# Patient Record
Sex: Female | Born: 1945 | Race: White | Hispanic: No | Marital: Married
Health system: Southern US, Community
[De-identification: ages and names within clinical notes are randomized; demographics above are authoritative.]

---

## 2015-05-23 ENCOUNTER — Other Ambulatory Visit (HOSPITAL_COMMUNITY): Payer: Self-pay

## 2015-05-23 ENCOUNTER — Inpatient Hospital Stay
Admission: AD | Admit: 2015-05-23 | Discharge: 2015-06-13 | Disposition: A | Discharge status: Skilled Nursing Facility | Payer: Medicare Other | Source: Ambulatory Visit | Attending: Internal Medicine | Admitting: Internal Medicine

## 2015-05-23 DIAGNOSIS — Z930 Tracheostomy status: Secondary | ICD-10-CM | POA: Insufficient documentation

## 2015-05-23 DIAGNOSIS — Z931 Gastrostomy status: Secondary | ICD-10-CM

## 2015-05-23 DIAGNOSIS — J9690 Respiratory failure, unspecified, unspecified whether with hypoxia or hypercapnia: Secondary | ICD-10-CM

## 2015-05-23 MED ORDER — IOHEXOL 300 MG/ML  SOLN
50.0000 mL | Freq: Once | INTRAMUSCULAR | Status: DC | PRN
  Administered 2015-05-23: 30 mL via ORAL

## 2015-05-24 ENCOUNTER — Other Ambulatory Visit (HOSPITAL_COMMUNITY): Payer: Self-pay

## 2015-05-24 DIAGNOSIS — J9620 Acute and chronic respiratory failure, unspecified whether with hypoxia or hypercapnia: Secondary | ICD-10-CM | POA: Diagnosis not present

## 2015-05-24 DIAGNOSIS — Z930 Tracheostomy status: Secondary | ICD-10-CM | POA: Insufficient documentation

## 2015-05-24 DIAGNOSIS — J9690 Respiratory failure, unspecified, unspecified whether with hypoxia or hypercapnia: Secondary | ICD-10-CM | POA: Insufficient documentation

## 2015-05-24 LAB — CBC
HCT: 43.7 % (ref 36.0–46.0)
Hemoglobin: 13.7 g/dL (ref 12.0–15.0)
MCH: 28.2 pg (ref 26.0–34.0)
MCHC: 31.4 g/dL (ref 30.0–36.0)
MCV: 89.9 fL (ref 78.0–100.0)
PLATELETS: 176 10*3/uL (ref 150–400)
RBC: 4.86 MIL/uL (ref 3.87–5.11)
RDW: 16 % — AB (ref 11.5–15.5)
WBC: 19.3 10*3/uL — AB (ref 4.0–10.5)

## 2015-05-24 LAB — URINALYSIS, ROUTINE W REFLEX MICROSCOPIC
BILIRUBIN URINE: NEGATIVE
GLUCOSE, UA: NEGATIVE mg/dL
HGB URINE DIPSTICK: NEGATIVE
Ketones, ur: NEGATIVE mg/dL
Leukocytes, UA: NEGATIVE
Nitrite: NEGATIVE
Protein, ur: NEGATIVE mg/dL
SPECIFIC GRAVITY, URINE: 1.008 (ref 1.005–1.030)
UROBILINOGEN UA: 0.2 mg/dL (ref 0.0–1.0)
pH: 5 (ref 5.0–8.0)

## 2015-05-24 LAB — TSH: TSH: 0.863 u[IU]/mL (ref 0.350–4.500)

## 2015-05-24 LAB — BASIC METABOLIC PANEL
ANION GAP: 11 (ref 5–15)
BUN: 71 mg/dL — ABNORMAL HIGH (ref 6–20)
CALCIUM: 8.8 mg/dL — AB (ref 8.9–10.3)
CO2: 26 mmol/L (ref 22–32)
CREATININE: 0.93 mg/dL (ref 0.44–1.00)
Chloride: 112 mmol/L — ABNORMAL HIGH (ref 101–111)
Glucose, Bld: 220 mg/dL — ABNORMAL HIGH (ref 65–99)
Potassium: 4.8 mmol/L (ref 3.5–5.1)
Sodium: 149 mmol/L — ABNORMAL HIGH (ref 135–145)

## 2015-05-24 NOTE — Procedures (Signed)
Tracheostomy tube change: Informed verbal consent was obtained after explaining the risks (including bleeding and infection), benefits and alternatives of the procedure. Verbal timeout was performed prior to the procedure. The old  # 6 cuffed trach was carefully removed. the tracheostomy site appeared: unremarkable w/ exception of the copious trach secretions. A new #  6 Cuffed  trach was easily placed over a tube changer in the tracheostomy stoma and secured with velcro trach ties. The tracheostomy was patent, good color change observed via EZ-CAP, and the patient was easily able to voice with finger occlusion and tolerated the procedure well with no immediate complications.   Simonne Martinet ACNP-BC [facility] Pulmonary/Critical Care Pager # [phone] OR # [phone] if no answer

## 2015-05-24 NOTE — Consult Note (Signed)
Name: Cathy Archer MRN: [id number] DOB: 11/04/1942    ADMISSION DATE:  05/23/2015 CONSULTATION DATE:  9/22 REFERRING MD :  Sharyon Medicus  CHIEF COMPLAINT:   Tracheostomy care, and ventilator management  BRIEF PATIENT DESCRIPTION:  This is a 69 year old female patient who [facility] with altered mental status in the setting of what was identified later as intracranial hemorrhage, with intraventricular hemorrhage, obstructive hydrocephalus and brain mass complicated by inability to protect airway in resulting respiratory failure. She was treated initially with intraventricular drain, then went to the operating room for craniotomy and subtotal resection of tumor. Hospital course highlights include failure to wean, with trach and PEG placed on 9/16. Klebsiella pneumonia, cultures on 9/13. [facility], with eventual plan to initiate radiation therapy ideally in the 2-3 weeks from time of discharge.  Upon time of admission patient was on daytime aerosol trach collar, and nocturnal ventilation. She was unable to get adequate tidal volumes, with notable air leak, raising concern for blood tracheostomy cuff, therefore pulmonary was asked to evaluate and assist with weaning efforts as well as tracheostomy change.  SIGNIFICANT EVENTS    STUDIES:    PAST MEDICAL HISTORY :  Past medical history includes colon polyps, arthritis, Barrett's esophagus, diabetes, H. Pylori with resultant gastritis, hyperlipidemia, nonalcoholic steatosis, systolic and diastolic congestive heart failure. Prior to Admission medications   Labetalol, when necessary. DuoNeb every 6 hours when necessary, baclofen 10 mg every 8 hours as needed, ceftriaxone every 24 hours stop the planned for 9/28, hydralazine 10 mg every 4 hours as needed for systolic blood pressure greater than 170, torsemide 20 mg, the tube daily, gabapentin 100 mg daily via tube,  nystatin oral suspension every 6 hours, multivitamin, lisinopril 20 mg tablet daily, atorvastatin 10 mg each bedtime, Protonix IV 40 mg daily, metoprolol 25 mg via tube twice a day, hydralazine 25 mg via tube 4 times a day, dexamethasone 6 mg every 8 hours intravenous sliding scale insulin.   Allergies No known drug allergies  FAMILY HISTORY:  Bladder cancer, Crohn's disease, diabetes, hypertension, liver cancer. SOCIAL HISTORY: Prior smoker  REVIEW OF SYSTEMS:   unable  SUBJECTIVE:  Resting comfortably in the bed VITAL SIGNS: Temperature 90.9, pulse rate 102, respirations 20, blood pressure 121/72, pulse ox 99%  PHYSICAL EXAMINATION: General:  69 year old white female status post craniotomy resting comfortably in bed Neuro:  Awake, affect flat. Moves all extremities, appropriate. HEENT:  Craniotomy surgical site unremarkable, #6 cuffed trach was midline, has copious thick tan blood-tinged secretions Cardiovascular:  Regular rate and rhythm Lungs:  Unlabored, scattered diffuse rhonchi, no accessory muscle use Abdomen:  Soft, nontender, no organomegaly, PEG unremarkable. Has rectal tube in place. Musculoskeletal:  Good and equal tone, moves all extremities. Skin: , no edema   Recent Labs Lab 05/24/15 0714  NA 149*  K 4.8  CL 112*  CO2 26  BUN 71*  CREATININE 0.93  GLUCOSE 220*    Recent Labs Lab 05/24/15 0714  HGB 13.7  HCT 43.7  WBC 19.3*  PLT 176   Dg Chest Port 1 View  05/24/2015   CLINICAL DATA:  Acute respiratory failure  EXAM: PORTABLE CHEST - 1 VIEW  COMPARISON:  None.  FINDINGS: Cardiac shadow is within normal limits. A left-sided central venous line is noted in the distal superior vena cava. A tracheostomy tube is noted in place. Minimal basilar atelectasis is noted on the left. Some slightly more  marked density is noted in the right lung. No acute bony abnormality is seen. Old left humeral fracture is noted.  IMPRESSION: Bibasilar atelectasis right greater  than left.   Electronically Signed   By: Alcide Clever M.D.   On: 05/24/2015 08:06   Dg Abd Portable 1v  05/23/2015   CLINICAL DATA:  Percutaneous endoscopic gastrostomy tube placement.  EXAM: PORTABLE ABDOMEN - 1 VIEW  COMPARISON:  None.  FINDINGS: 30 mL Omnipaque was instilled via gastrostomy tube. Gastrostomy tube balloon is located along the distal greater curvature of the stomach. Contrast material is seen in the stomach and proximal duodenum. No contrast extravasation. Appearance is consistent with tube location within the stomach.  IMPRESSION: Gastrostomy tube appears to be located along the greater curvature of the stomach.   Electronically Signed   By: Burman Nieves M.D.   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 Still has copious purulent blood-tinged tracheal secretions. Tolerating aerosol trach collar otherwise. At this point she remains on trach collar during the day, and mechanical ventilation at night, she should be able to progress to trach collar only very soon. At this time her copious tracheal secretions would prevent her from speech valve trials. Unclear at this point as to whether or or not she will be a candidate for decannulation, her mental status will be the limiting factor for this. [facility] Would hold off on speaking valve trials at this point, given copious tracheal secretions Reculture sputum Continue current antibiotics for now It is too early to speculate as to whether not she can be decannulated  Status post subtotal resection of brain mass. This was complicated by intracranial  hemorrhage, with intraventricular hemorrhage, and obstructive hydrocephalus. The mass was not completely resected, there is concern about recurrence. Recommendations from outside hospital were to begin radiation therapy within 2-3 weeks Plan Continue rehabilitation efforts Continue Decadron, will defer to primary service, unclear what weaning plans were for this.  Hypertension Plan Continue antihypertensive regimen per primary team  Hypernatremia Plan Would recommend changing saline, and adding free water  Protein calorie malnutrition Plan Continue supplementation  Physical deconditioning Plan Continue rehabilitation efforts  Hyperglycemia, likely steroid induced. Plan Sliding scale insulin  .Simonne Martinet ACNP-BC [facility] Pulmonary/Critical Care Pager # [phone] OR # [phone] if no answer   05/24/2015, 9:09 AM  Reviewed above.  69 yo female admitted with IVH from brain mass.  Intubated for airway protection and eventually needed trach.  She was tx for Klebsiella PNA.  She has been doing trach collar during day, and vent support at night.  She follows simple commands.  Trach site with clear to yellow/red secretions.  Scattered rhonchi.  Abd soft, PEG site clean.  Labs from 9/22 reviewed and remarkable for >> WBC 19.3, Na 149, Glucose 220.  She is noted to have trach cuff blown >> changed 9/22.  Will continue trach collar during day and vent support at night.  She might eventually wean off vent.  Would not consider decannulation until her physical conditioning is significantly more improved.  Coralyn Helling, MD [facility] Pulmonary/Critical Care 05/24/2015, 10:23 AM Pager:  [phone] After 3pm call: [phone]

## 2015-05-25 LAB — BASIC METABOLIC PANEL
Anion gap: 12 (ref 5–15)
BUN: 125 mg/dL — AB (ref 6–20)
CALCIUM: 8 mg/dL — AB (ref 8.9–10.3)
CHLORIDE: 111 mmol/L (ref 101–111)
CO2: 25 mmol/L (ref 22–32)
CREATININE: 1.93 mg/dL — AB (ref 0.44–1.00)
GFR calc Af Amer: 29 mL/min — ABNORMAL LOW (ref >60)
GFR calc non Af Amer: 25 mL/min — ABNORMAL LOW (ref >60)
Glucose, Bld: 280 mg/dL — ABNORMAL HIGH (ref 65–99)
Potassium: 5.5 mmol/L — ABNORMAL HIGH (ref 3.5–5.1)
SODIUM: 148 mmol/L — AB (ref 135–145)

## 2015-05-25 LAB — CBC
HCT: 40.4 % (ref 36.0–46.0)
HEMOGLOBIN: 12.4 g/dL (ref 12.0–15.0)
MCH: 28 pg (ref 26.0–34.0)
MCHC: 30.7 g/dL (ref 30.0–36.0)
MCV: 91.2 fL (ref 78.0–100.0)
Platelets: 130 10*3/uL — ABNORMAL LOW (ref 150–400)
RBC: 4.43 MIL/uL (ref 3.87–5.11)
RDW: 16.7 % — AB (ref 11.5–15.5)
WBC: 14.5 10*3/uL — ABNORMAL HIGH (ref 4.0–10.5)

## 2015-05-25 LAB — URINE CULTURE: CULTURE: NO GROWTH

## 2015-05-25 LAB — TSH: TSH: 3.605 u[IU]/mL (ref 0.350–4.500)

## 2015-05-25 LAB — MAGNESIUM: MAGNESIUM: 2.9 mg/dL — AB (ref 1.7–2.4)

## 2015-05-26 ENCOUNTER — Other Ambulatory Visit (HOSPITAL_COMMUNITY): Payer: Self-pay

## 2015-05-26 LAB — URINE MICROSCOPIC-ADD ON

## 2015-05-26 LAB — CBC
HEMATOCRIT: 41.2 % (ref 36.0–46.0)
HEMOGLOBIN: 12.7 g/dL (ref 12.0–15.0)
MCH: 28.4 pg (ref 26.0–34.0)
MCHC: 30.8 g/dL (ref 30.0–36.0)
MCV: 92.2 fL (ref 78.0–100.0)
Platelets: 121 10*3/uL — ABNORMAL LOW (ref 150–400)
RBC: 4.47 MIL/uL (ref 3.87–5.11)
RDW: 16.8 % — ABNORMAL HIGH (ref 11.5–15.5)
WBC: 16.8 10*3/uL — ABNORMAL HIGH (ref 4.0–10.5)

## 2015-05-26 LAB — URINALYSIS, ROUTINE W REFLEX MICROSCOPIC
BILIRUBIN URINE: NEGATIVE
Ketones, ur: NEGATIVE mg/dL
Leukocytes, UA: NEGATIVE
Nitrite: NEGATIVE
PROTEIN: NEGATIVE mg/dL
Specific Gravity, Urine: 1.022 (ref 1.005–1.030)
Urobilinogen, UA: 0.2 mg/dL (ref 0.0–1.0)
pH: 5 (ref 5.0–8.0)

## 2015-05-26 LAB — BASIC METABOLIC PANEL
ANION GAP: 12 (ref 5–15)
BUN: 88 mg/dL — ABNORMAL HIGH (ref 6–20)
CHLORIDE: 110 mmol/L (ref 101–111)
CO2: 29 mmol/L (ref 22–32)
Calcium: 8.1 mg/dL — ABNORMAL LOW (ref 8.9–10.3)
Creatinine, Ser: 1.21 mg/dL — ABNORMAL HIGH (ref 0.44–1.00)
GFR calc non Af Amer: 44 mL/min — ABNORMAL LOW (ref >60)
GFR, EST AFRICAN AMERICAN: 51 mL/min — AB (ref >60)
Glucose, Bld: 411 mg/dL — ABNORMAL HIGH (ref 65–99)
Potassium: 4.1 mmol/L (ref 3.5–5.1)
Sodium: 151 mmol/L — ABNORMAL HIGH (ref 135–145)

## 2015-05-28 DIAGNOSIS — E870 Hyperosmolality and hypernatremia: Secondary | ICD-10-CM | POA: Diagnosis not present

## 2015-05-28 DIAGNOSIS — J9620 Acute and chronic respiratory failure, unspecified whether with hypoxia or hypercapnia: Secondary | ICD-10-CM

## 2015-05-28 DIAGNOSIS — E46 Unspecified protein-calorie malnutrition: Secondary | ICD-10-CM

## 2015-05-28 DIAGNOSIS — Z930 Tracheostomy status: Secondary | ICD-10-CM

## 2015-05-28 LAB — BASIC METABOLIC PANEL
ANION GAP: 6 (ref 5–15)
BUN: 55 mg/dL — ABNORMAL HIGH (ref 6–20)
CO2: 33 mmol/L — ABNORMAL HIGH (ref 22–32)
Calcium: 8.8 mg/dL — ABNORMAL LOW (ref 8.9–10.3)
Chloride: 117 mmol/L — ABNORMAL HIGH (ref 101–111)
Creatinine, Ser: 0.99 mg/dL (ref 0.44–1.00)
GFR, EST NON AFRICAN AMERICAN: 56 mL/min — AB (ref >60)
Glucose, Bld: 372 mg/dL — ABNORMAL HIGH (ref 65–99)
POTASSIUM: 4.5 mmol/L (ref 3.5–5.1)
SODIUM: 156 mmol/L — AB (ref 135–145)

## 2015-05-28 LAB — CBC
HEMATOCRIT: 38.4 % (ref 36.0–46.0)
HEMOGLOBIN: 11.8 g/dL — AB (ref 12.0–15.0)
MCH: 28.6 pg (ref 26.0–34.0)
MCHC: 30.7 g/dL (ref 30.0–36.0)
MCV: 93.2 fL (ref 78.0–100.0)
Platelets: 81 10*3/uL — ABNORMAL LOW (ref 150–400)
RBC: 4.12 MIL/uL (ref 3.87–5.11)
RDW: 16.6 % — AB (ref 11.5–15.5)
WBC: 10.4 10*3/uL (ref 4.0–10.5)

## 2015-05-28 NOTE — Progress Notes (Signed)
Name: Cathy Archer MRN: [id number] DOB: [birth year]    ADMISSION DATE:  05/23/2015 CONSULTATION DATE:  9/22 REFERRING MD :  Sharyon Medicus  CHIEF COMPLAINT:   Tracheostomy care, and ventilator management  BRIEF PATIENT DESCRIPTION:  This is a 69 year old female patient who [facility] with altered mental status in the setting of what was identified later as intracranial hemorrhage, with intraventricular hemorrhage, obstructive hydrocephalus and brain mass complicated by inability to protect airway in resulting respiratory failure. She was treated initially with intraventricular drain, then went to the operating room for craniotomy and subtotal resection of tumor. Hospital course highlights include failure to wean, with trach and PEG placed on 9/16. Klebsiella pneumonia, cultures on 9/13. [facility], with eventual plan to initiate radiation therapy ideally in the 2-3 weeks from time of discharge.  Upon time of admission patient was on daytime aerosol trach collar, and nocturnal ventilation. She was unable to get adequate tidal volumes, with notable air leak, raising concern for blood tracheostomy cuff, therefore pulmonary was asked to evaluate and assist with weaning efforts as well as tracheostomy change.  SIGNIFICANT EVENTS  PCCM consulted 9/22: ruptured trach cuff noted so it was replaced.   SUBJECTIVE:  Resting comfortably in the bed VITAL SIGNS: Temperature 98.5, HR 113, rr 20 sats 93% on 35% PHYSICAL EXAMINATION: General:  69 year old white female status post craniotomy resting comfortably in bed Neuro:  Awake, affect flat/ withdrawn Moves all extremities, appropriate. HEENT:  Craniotomy surgical site unremarkable, #6 cuffed trach was midline, secretions a little better  Cardiovascular:  Regular rate and rhythm Lungs:  Unlabored, scattered rhonchi, no accessory muscle use Abdomen:  Soft, nontender,  no organomegaly, PEG unremarkable. Has rectal tube in place. Musculoskeletal:  Good and equal tone, moves all extremities. Skin: , no edema   Recent Labs Lab 05/25/15 2020 05/26/15 1245 05/28/15 0600  NA 148* 151* 156*  K 5.5* 4.1 4.5  CL 111 110 117*  CO2 25 29 33*  BUN 125* 88* 55*  CREATININE 1.93* 1.21* 0.99  GLUCOSE 280* 411* 372*    Recent Labs Lab 05/25/15 2020 05/26/15 1245 05/28/15 0810  HGB 12.4 12.7 11.8*  HCT 40.4 41.2 38.4  WBC 14.5* 16.8* 10.4  PLT 130* 121* PENDING   No results found. 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 At this point she remains on trach collar during the day.  Unclear at this point as to whether or or not she will be a candidate for decannulation, her mental status will be the limiting factor for this. [facility] Would hold off on speaking valve trials at this point, given copious tracheal secretions f/u sputum Continue current antibiotics for now It is too early to speculate as to whether not she can be decannulated  Status post subtotal resection of brain mass. This was complicated by intracranial hemorrhage, with intraventricular hemorrhage, and obstructive hydrocephalus. The mass was not completely resected, there is concern about recurrence. Recommendations from outside hospital were to begin radiation therapy within 2-3 weeks Plan Continue rehabilitation efforts Continue Decadron, will defer to primary service, unclear what weaning plans were for this.  Hypertension Plan Continue antihypertensive  regimen per primary team  Hypernatremia & hyperchloremia--> this  continues to worsen.  Plan Would d/c NS and replace free water.   Protein calorie malnutrition Plan Continue supplementation  Physical deconditioning Plan Continue rehabilitation efforts  Hyperglycemia, likely steroid induced. Plan Sliding scale insulin  .Simonne Martinet ACNP-BC [facility] Pulmonary/Critical Care Pager # [phone] OR # [phone] if no answer  Attending Note:  Above reviewed.  I reviewed CXR myself, tracheostomy in good position.  Discussed with PCCM-NP and SSH-MD.  69 year female with ICH from brain mass.  Intubated then now trached for airway protection.  Weaning to TC during the day and vent at night.  Increased secretion and follows simple commands on exam.  I reviewed labs 9/26 myself WBC down to 10.4 but Na is up to 156.  Continue weaning per protocol as above, no PMV given copious secretion.  Address hypernatremia with d/c of NS and place on free water.  F/U labs as ordered.  Needs more aggressive rehab and supplementation of diet as above.   PCCM will continue to follow.  Patient seen and examined, agree with above note.  I dictated the care and orders written for this patient under my direction.  Alyson Reedy, MD [phone]  05/28/2015, 9:20 AM

## 2015-05-29 LAB — BASIC METABOLIC PANEL
Anion gap: 11 (ref 5–15)
BUN: 61 mg/dL — ABNORMAL HIGH (ref 6–20)
CHLORIDE: 111 mmol/L (ref 101–111)
CO2: 29 mmol/L (ref 22–32)
CREATININE: 1.16 mg/dL — AB (ref 0.44–1.00)
Calcium: 8.4 mg/dL — ABNORMAL LOW (ref 8.9–10.3)
GFR calc non Af Amer: 48 mL/min — ABNORMAL LOW (ref >60)
GFR, EST AFRICAN AMERICAN: 55 mL/min — AB (ref >60)
Glucose, Bld: 413 mg/dL — ABNORMAL HIGH (ref 65–99)
Potassium: 4.2 mmol/L (ref 3.5–5.1)
Sodium: 151 mmol/L — ABNORMAL HIGH (ref 135–145)

## 2015-05-29 LAB — CULTURE, RESPIRATORY W GRAM STAIN

## 2015-05-29 LAB — CULTURE, RESPIRATORY

## 2015-05-30 LAB — COMPREHENSIVE METABOLIC PANEL
ALBUMIN: 1.6 g/dL — AB (ref 3.5–5.0)
ALK PHOS: 84 U/L (ref 38–126)
ALT: 65 U/L — AB (ref 14–54)
ANION GAP: 6 (ref 5–15)
AST: 27 U/L (ref 15–41)
BUN: 55 mg/dL — AB (ref 6–20)
CALCIUM: 8.1 mg/dL — AB (ref 8.9–10.3)
CO2: 32 mmol/L (ref 22–32)
CREATININE: 0.93 mg/dL (ref 0.44–1.00)
Chloride: 108 mmol/L (ref 101–111)
GFR calc Af Amer: >60 mL/min (ref >60)
GFR calc non Af Amer: >60 mL/min (ref >60)
GLUCOSE: 427 mg/dL — AB (ref 65–99)
Potassium: 5 mmol/L (ref 3.5–5.1)
SODIUM: 146 mmol/L — AB (ref 135–145)
Total Bilirubin: 0.8 mg/dL (ref 0.3–1.2)
Total Protein: 4.1 g/dL — ABNORMAL LOW (ref 6.5–8.1)

## 2015-05-31 LAB — BASIC METABOLIC PANEL
ANION GAP: 6 (ref 5–15)
BUN: 44 mg/dL — ABNORMAL HIGH (ref 6–20)
CALCIUM: 8.1 mg/dL — AB (ref 8.9–10.3)
CHLORIDE: 101 mmol/L (ref 101–111)
CO2: 31 mmol/L (ref 22–32)
CREATININE: 0.75 mg/dL (ref 0.44–1.00)
GFR calc non Af Amer: >60 mL/min (ref >60)
Glucose, Bld: 380 mg/dL — ABNORMAL HIGH (ref 65–99)
Potassium: 4.8 mmol/L (ref 3.5–5.1)
SODIUM: 138 mmol/L (ref 135–145)

## 2015-06-02 ENCOUNTER — Other Ambulatory Visit (HOSPITAL_COMMUNITY): Payer: Self-pay

## 2015-06-02 LAB — CBC
HEMATOCRIT: 34.1 % — AB (ref 36.0–46.0)
Hemoglobin: 11.2 g/dL — ABNORMAL LOW (ref 12.0–15.0)
MCH: 28.5 pg (ref 26.0–34.0)
MCHC: 32.8 g/dL (ref 30.0–36.0)
MCV: 86.8 fL (ref 78.0–100.0)
Platelets: 23 10*3/uL — CL (ref 150–400)
RBC: 3.93 MIL/uL (ref 3.87–5.11)
RDW: 15.4 % (ref 11.5–15.5)
WBC: 11.4 10*3/uL — ABNORMAL HIGH (ref 4.0–10.5)

## 2015-06-02 LAB — BASIC METABOLIC PANEL
Anion gap: 9 (ref 5–15)
BUN: 32 mg/dL — AB (ref 6–20)
CALCIUM: 8.4 mg/dL — AB (ref 8.9–10.3)
CO2: 27 mmol/L (ref 22–32)
CREATININE: 0.58 mg/dL (ref 0.44–1.00)
Chloride: 105 mmol/L (ref 101–111)
GFR calc Af Amer: >60 mL/min (ref >60)
Glucose, Bld: 282 mg/dL — ABNORMAL HIGH (ref 65–99)
Potassium: 4.4 mmol/L (ref 3.5–5.1)
SODIUM: 141 mmol/L (ref 135–145)

## 2015-06-02 LAB — MAGNESIUM: MAGNESIUM: 2.2 mg/dL (ref 1.7–2.4)

## 2015-06-03 LAB — CBC
HCT: 32.8 % — ABNORMAL LOW (ref 36.0–46.0)
HEMOGLOBIN: 10.9 g/dL — AB (ref 12.0–15.0)
MCH: 28.6 pg (ref 26.0–34.0)
MCHC: 33.2 g/dL (ref 30.0–36.0)
MCV: 86.1 fL (ref 78.0–100.0)
Platelets: 31 10*3/uL — ABNORMAL LOW (ref 150–400)
RBC: 3.81 MIL/uL — AB (ref 3.87–5.11)
RDW: 15.5 % (ref 11.5–15.5)
WBC: 13.2 10*3/uL — AB (ref 4.0–10.5)

## 2015-06-03 LAB — BRAIN NATRIURETIC PEPTIDE: B Natriuretic Peptide: 138.6 pg/mL — ABNORMAL HIGH (ref 0.0–100.0)

## 2015-06-04 LAB — CBC
HEMATOCRIT: 35.6 % — AB (ref 36.0–46.0)
HEMOGLOBIN: 11.5 g/dL — AB (ref 12.0–15.0)
MCH: 28 pg (ref 26.0–34.0)
MCHC: 32.3 g/dL (ref 30.0–36.0)
MCV: 86.8 fL (ref 78.0–100.0)
Platelets: 43 10*3/uL — ABNORMAL LOW (ref 150–400)
RBC: 4.1 MIL/uL (ref 3.87–5.11)
RDW: 15.7 % — AB (ref 11.5–15.5)
WBC: 15.3 10*3/uL — ABNORMAL HIGH (ref 4.0–10.5)

## 2015-06-04 LAB — BASIC METABOLIC PANEL
Anion gap: 10 (ref 5–15)
BUN: 27 mg/dL — AB (ref 6–20)
CALCIUM: 8.2 mg/dL — AB (ref 8.9–10.3)
CHLORIDE: 95 mmol/L — AB (ref 101–111)
CO2: 29 mmol/L (ref 22–32)
CREATININE: 0.65 mg/dL (ref 0.44–1.00)
GFR calc Af Amer: >60 mL/min (ref >60)
GFR calc non Af Amer: >60 mL/min (ref >60)
GLUCOSE: 335 mg/dL — AB (ref 65–99)
Potassium: 4.1 mmol/L (ref 3.5–5.1)
Sodium: 134 mmol/L — ABNORMAL LOW (ref 135–145)

## 2015-06-05 DIAGNOSIS — R5381 Other malaise: Secondary | ICD-10-CM

## 2015-06-05 DIAGNOSIS — Z930 Tracheostomy status: Secondary | ICD-10-CM | POA: Diagnosis not present

## 2015-06-05 DIAGNOSIS — J9620 Acute and chronic respiratory failure, unspecified whether with hypoxia or hypercapnia: Secondary | ICD-10-CM | POA: Diagnosis not present

## 2015-06-05 LAB — GRAM STAIN

## 2015-06-05 NOTE — Progress Notes (Signed)
Name: Cathy Archer MRN: [id number] DOB: 1946-07-24    ADMISSION DATE:  05/23/2015 CONSULTATION DATE:  9/22 REFERRING MD :  Sharyon Medicus  CHIEF COMPLAINT:   Tracheostomy care, and ventilator management  BRIEF PATIENT DESCRIPTION:  69 year old female patient who was admitted to [facility] on 05/03/2015 with altered mental status in the setting of what was identified later as intracranial hemorrhage, with intraventricular hemorrhage, obstructive hydrocephalus and brain mass complicated by inability to protect airway in resulting respiratory failure. She was treated initially with intraventricular drain, then went to the operating room for craniotomy and subtotal resection of tumor. Hospital course highlights include failure to wean, with trach and PEG placed on 9/16. Klebsiella pneumonia, cultures on 9/13. She was transferred to [facility] for weaning efforts, with eventual plan to initiate radiation therapy ideally in the 2-3 weeks from time of discharge.  Upon time of admission patient was on daytime aerosol trach collar, and nocturnal ventilation. She was unable to get adequate tidal volumes, with notable air leak, raising concern for blown tracheostomy cuff, therefore pulmonary was asked to evaluate and assist with weaning efforts as well as tracheostomy change.  SIGNIFICANT EVENTS  9/22  PCCM consulted ruptured trach cuff noted so it was replaced.   SUBJECTIVE:  RT reports pt weaning on ATC 24/7.  No plans for decannulation  VITAL SIGNS:  98.6, 64, 14, 102/50, 95%  PHYSICAL EXAMINATION: General:  WF resting comfortably in bed Neuro:  Awake, affect flat/ withdrawn, moves all extremities, appropriate. HEENT:  Craniotomy surgical site unremarkable, trach midline, secretions a little better  Cardiovascular:  Regular rate and rhythm Lungs:  Unlabored, scattered rhonchi, no accessory muscle use Abdomen:  Soft, nontender, no organomegaly, PEG unremarkable. Has rectal tube in  place. Musculoskeletal:  Good and equal tone, moves all extremities. Skin: , no edema   Recent Labs Lab 05/31/15 0600 06/02/15 0835 06/04/15 0524  NA 138 141 134*  K 4.8 4.4 4.1  CL 101 105 95*  CO2 BUN 44* 32* 27*  CREATININE 0.75 0.58 0.65  GLUCOSE 380* 282* 335*    Recent Labs Lab 06/02/15 0835 06/03/15 0520 06/04/15 0524  HGB 11.2* 10.9* 11.5*  HCT 34.1* 32.8* 35.6*  WBC 11.4* 13.2* 15.3*  PLT 23* 31* 43*   No results found.   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 At this point she remains on trach collar during the day.  Unclear at this point as to whether or or not she will be a candidate for decannulation, her mental status will be the limiting factor for this.  She would need to be alert and physically stronger before consideration.    Plan [facility] ATC as tolerated  Wean O2 for saturations > 90% Pulmonary hygiene as able  Defer antibiotics to primary service Note 9/24 sputum culture with serratia  It is too early to speculate as to whether not she can be decannulated.  However, no plans at this time.   Status post subtotal resection of brain mass. This was complicated by intracranial hemorrhage, with intraventricular hemorrhage, and obstructive hydrocephalus. The mass was not completely resected, there is concern about recurrence. Recommendations from outside hospital were to begin radiation therapy within 2-3 weeks  Plan Continue  rehabilitation efforts Decadron per primary service.  Hypertension  Plan Continue antihypertensive regimen per primary team  Hypernatremia & hyperchloremia--> this continues to worsen.   Plan Monitor serial  BMP Free water for goal Na of 135-145 Defer to primary   Protein calorie malnutrition Plan Continue supplementation  Physical deconditioning Plan Continue rehabilitation efforts  Hyperglycemia, likely steroid induced. Plan Sliding scale insulin   PCCM will be available as needed.  Please call back if patient has progress toward decannulation for re-evaluation.    Canary Brim, NP-C [place] Pulmonary & Critical Care Pgr: [phone] or if no answer [phone] 06/05/2015, 10:46 AM  Attending Note:  Above reviewed.  I reviewed CXR myself, tracheostomy in good position.  Discussed with PCCM-NP and SSH-MD.  69 year female with ICH from brain mass. Intubated then now trached for airway protection. Weaning to TC during the day and vent at night. Increased secretion and follows simple commands on exam.  I reviewed labs 10/03 myself WBC up to 15.3 but Na is down to 134.  TC 24/7 at this point.  Patient has very little control over her neck and will need further radiation.  Would not recommend decannulation under any circumstances.  PCCM will sign off, please call back if needed.  Patient seen and examined, agree with above note. I dictated the care and orders written for this patient under my direction.  Alyson Reedy, MD [phone]

## 2015-06-06 LAB — BASIC METABOLIC PANEL
Anion gap: 10 (ref 5–15)
BUN: 29 mg/dL — AB (ref 6–20)
CALCIUM: 8.1 mg/dL — AB (ref 8.9–10.3)
CHLORIDE: 96 mmol/L — AB (ref 101–111)
CO2: 30 mmol/L (ref 22–32)
CREATININE: 0.58 mg/dL (ref 0.44–1.00)
GFR calc Af Amer: >60 mL/min (ref >60)
GFR calc non Af Amer: >60 mL/min (ref >60)
Glucose, Bld: 270 mg/dL — ABNORMAL HIGH (ref 65–99)
Potassium: 4.2 mmol/L (ref 3.5–5.1)
SODIUM: 136 mmol/L (ref 135–145)

## 2015-06-07 LAB — CBC
HCT: 36.4 % (ref 36.0–46.0)
HEMOGLOBIN: 11.5 g/dL — AB (ref 12.0–15.0)
MCH: 28 pg (ref 26.0–34.0)
MCHC: 31.6 g/dL (ref 30.0–36.0)
MCV: 88.8 fL (ref 78.0–100.0)
Platelets: 87 10*3/uL — ABNORMAL LOW (ref 150–400)
RBC: 4.1 MIL/uL (ref 3.87–5.11)
RDW: 16.5 % — ABNORMAL HIGH (ref 11.5–15.5)
WBC: 10.2 10*3/uL (ref 4.0–10.5)

## 2015-06-08 LAB — CULTURE, RESPIRATORY

## 2015-06-08 LAB — CULTURE, RESPIRATORY W GRAM STAIN

## 2015-06-11 LAB — CBC
HEMATOCRIT: 32.3 % — AB (ref 36.0–46.0)
HEMOGLOBIN: 10.1 g/dL — AB (ref 12.0–15.0)
MCH: 28.1 pg (ref 26.0–34.0)
MCHC: 31.3 g/dL (ref 30.0–36.0)
MCV: 90 fL (ref 78.0–100.0)
Platelets: 163 10*3/uL (ref 150–400)
RBC: 3.59 MIL/uL — AB (ref 3.87–5.11)
RDW: 17.1 % — ABNORMAL HIGH (ref 11.5–15.5)
WBC: 8.9 10*3/uL (ref 4.0–10.5)

## 2015-06-11 LAB — BASIC METABOLIC PANEL
ANION GAP: 8 (ref 5–15)
BUN: 26 mg/dL — ABNORMAL HIGH (ref 6–20)
CALCIUM: 8.1 mg/dL — AB (ref 8.9–10.3)
CHLORIDE: 94 mmol/L — AB (ref 101–111)
CO2: 35 mmol/L — AB (ref 22–32)
Creatinine, Ser: 0.49 mg/dL (ref 0.44–1.00)
GFR calc non Af Amer: >60 mL/min (ref >60)
GLUCOSE: 300 mg/dL — AB (ref 65–99)
POTASSIUM: 4 mmol/L (ref 3.5–5.1)
Sodium: 137 mmol/L (ref 135–145)

## 2016-10-03 IMAGING — CR DG ABD PORTABLE 1V
1 series · 1 of 1 positions shown · non-contrast
Comparison: None.

CLINICAL DATA: Percutaneous endoscopic gastrostomy tube placement.

EXAM:
PORTABLE ABDOMEN - 1 VIEW

[AP]
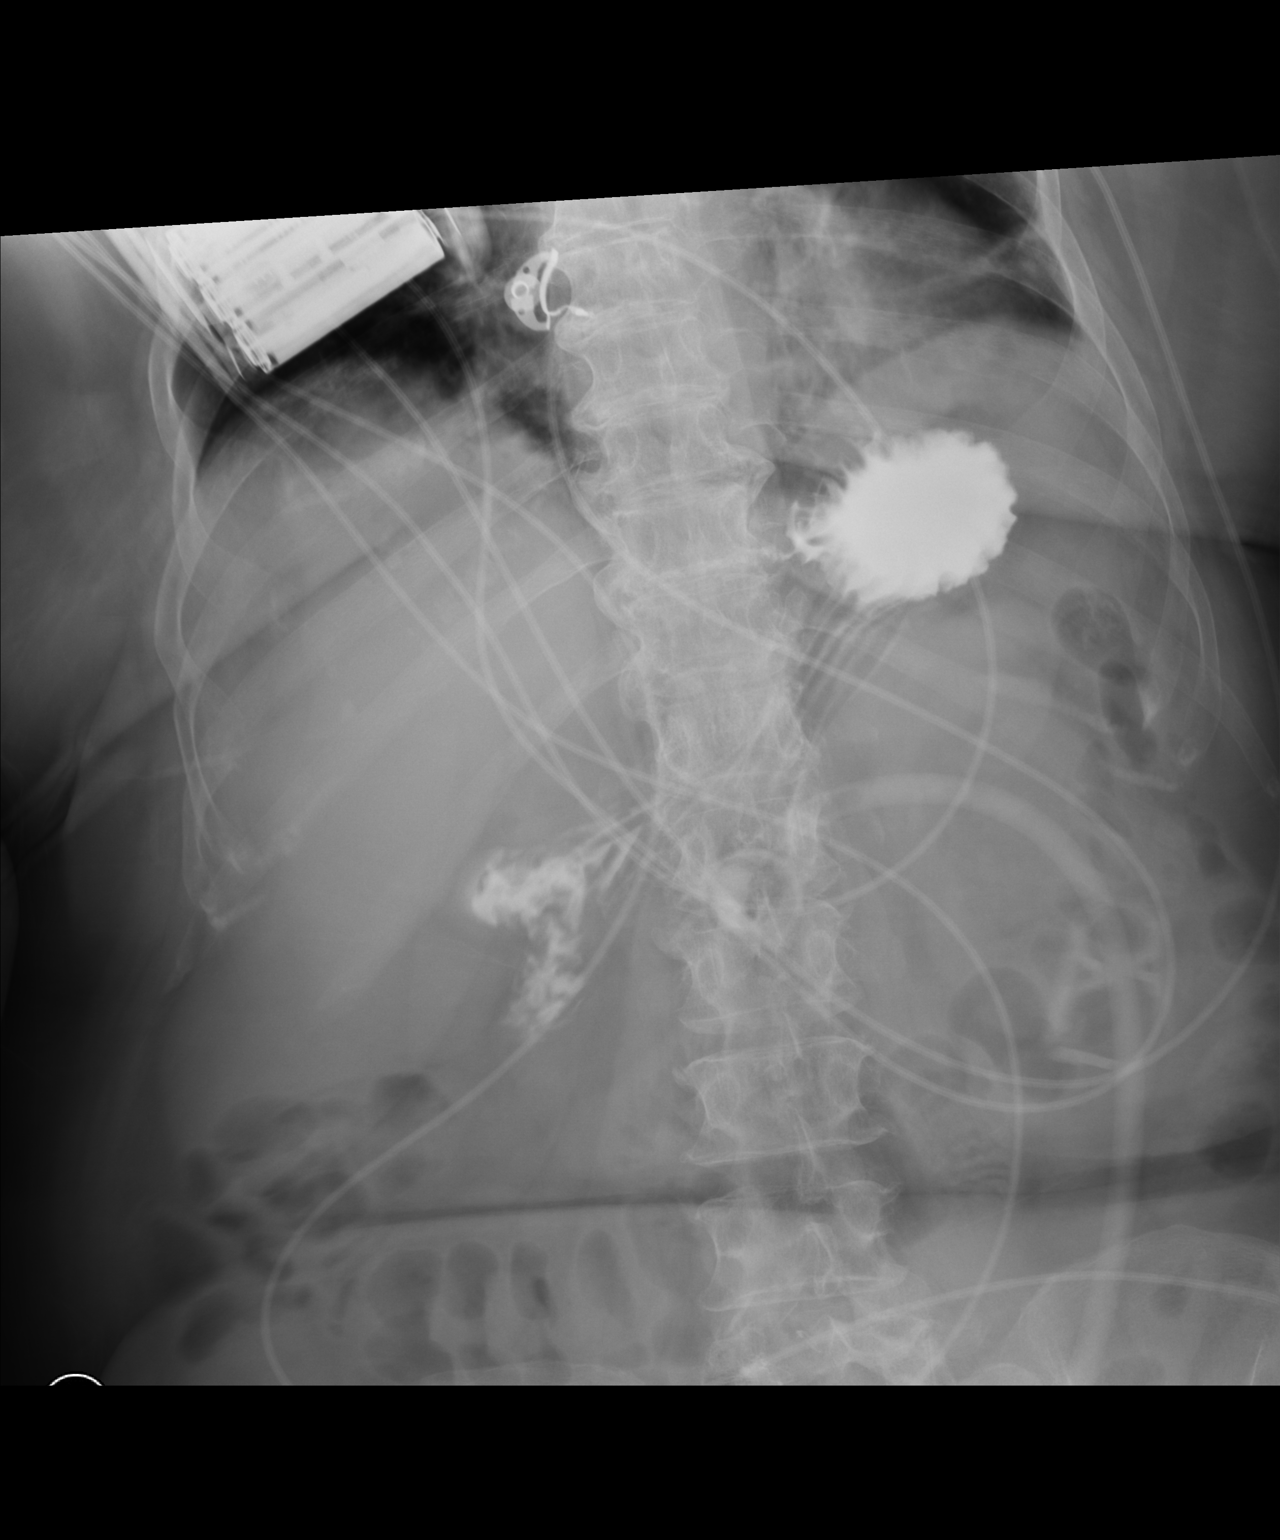

[1 of 1 positions shown; findings below may reference images not displayed]

FINDINGS: 30 mL Omnipaque was instilled via gastrostomy tube. Gastrostomy tube
balloon is located along the distal greater curvature of the
stomach. Contrast material is seen in the stomach and proximal
duodenum. No contrast extravasation. Appearance is consistent with
tube location within the stomach.
IMPRESSION: Gastrostomy tube appears to be located along the greater curvature
of the stomach.

## 2016-10-04 IMAGING — CR DG CHEST 1V PORT
1 series · 1 of 1 positions shown · non-contrast
Comparison: None.

CLINICAL DATA: Acute respiratory failure

EXAM:
PORTABLE CHEST - 1 VIEW

[AP]
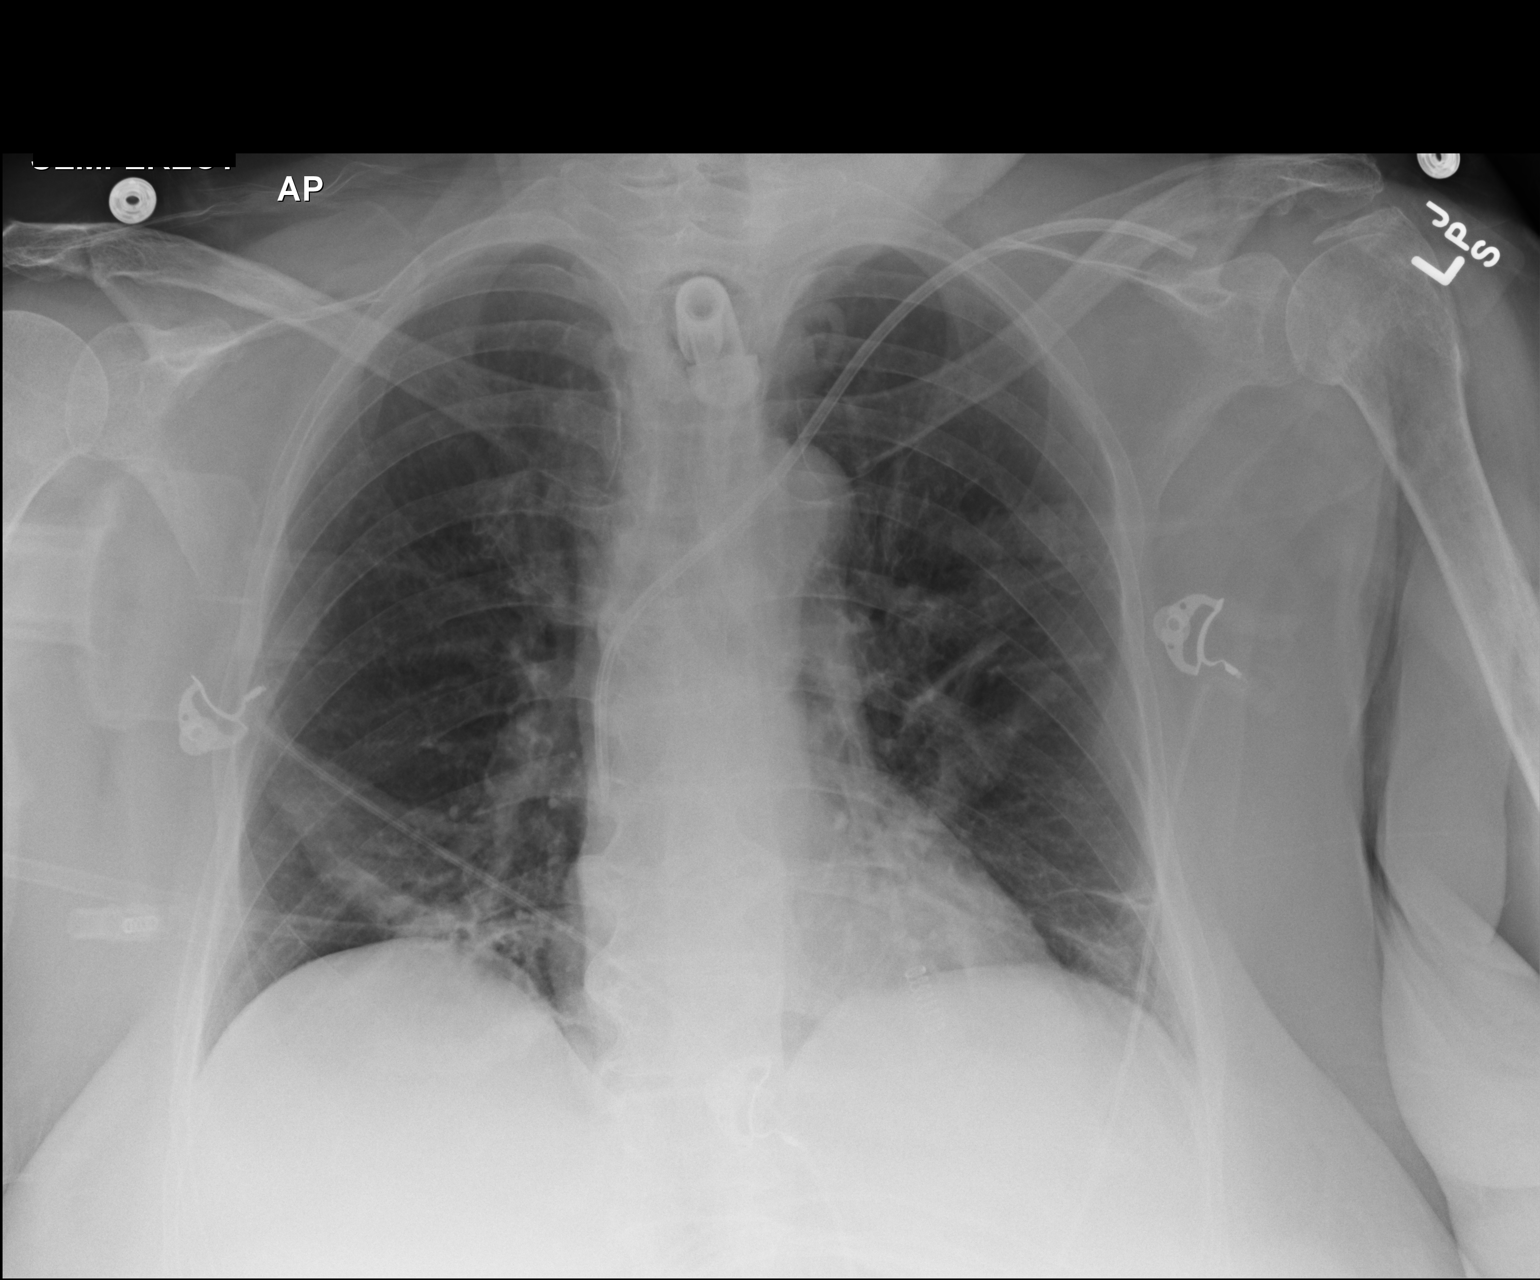

[1 of 1 positions shown; findings below may reference images not displayed]

FINDINGS: Cardiac shadow is within normal limits. A left-sided central venous
line is noted in the distal superior vena cava. A tracheostomy tube
is noted in place. Minimal basilar atelectasis is noted on the left.
Some slightly more marked density is noted in the right lung. No
acute bony abnormality is seen. Old left humeral fracture is noted.
IMPRESSION: Bibasilar atelectasis right greater than left.

## 2016-10-06 IMAGING — CR DG CHEST 1V PORT
1 series · 1 of 1 positions shown · non-contrast
Comparison: 05/24/2015

CLINICAL DATA: Respiratory failure.

EXAM:
PORTABLE CHEST 1 VIEW

[AP]
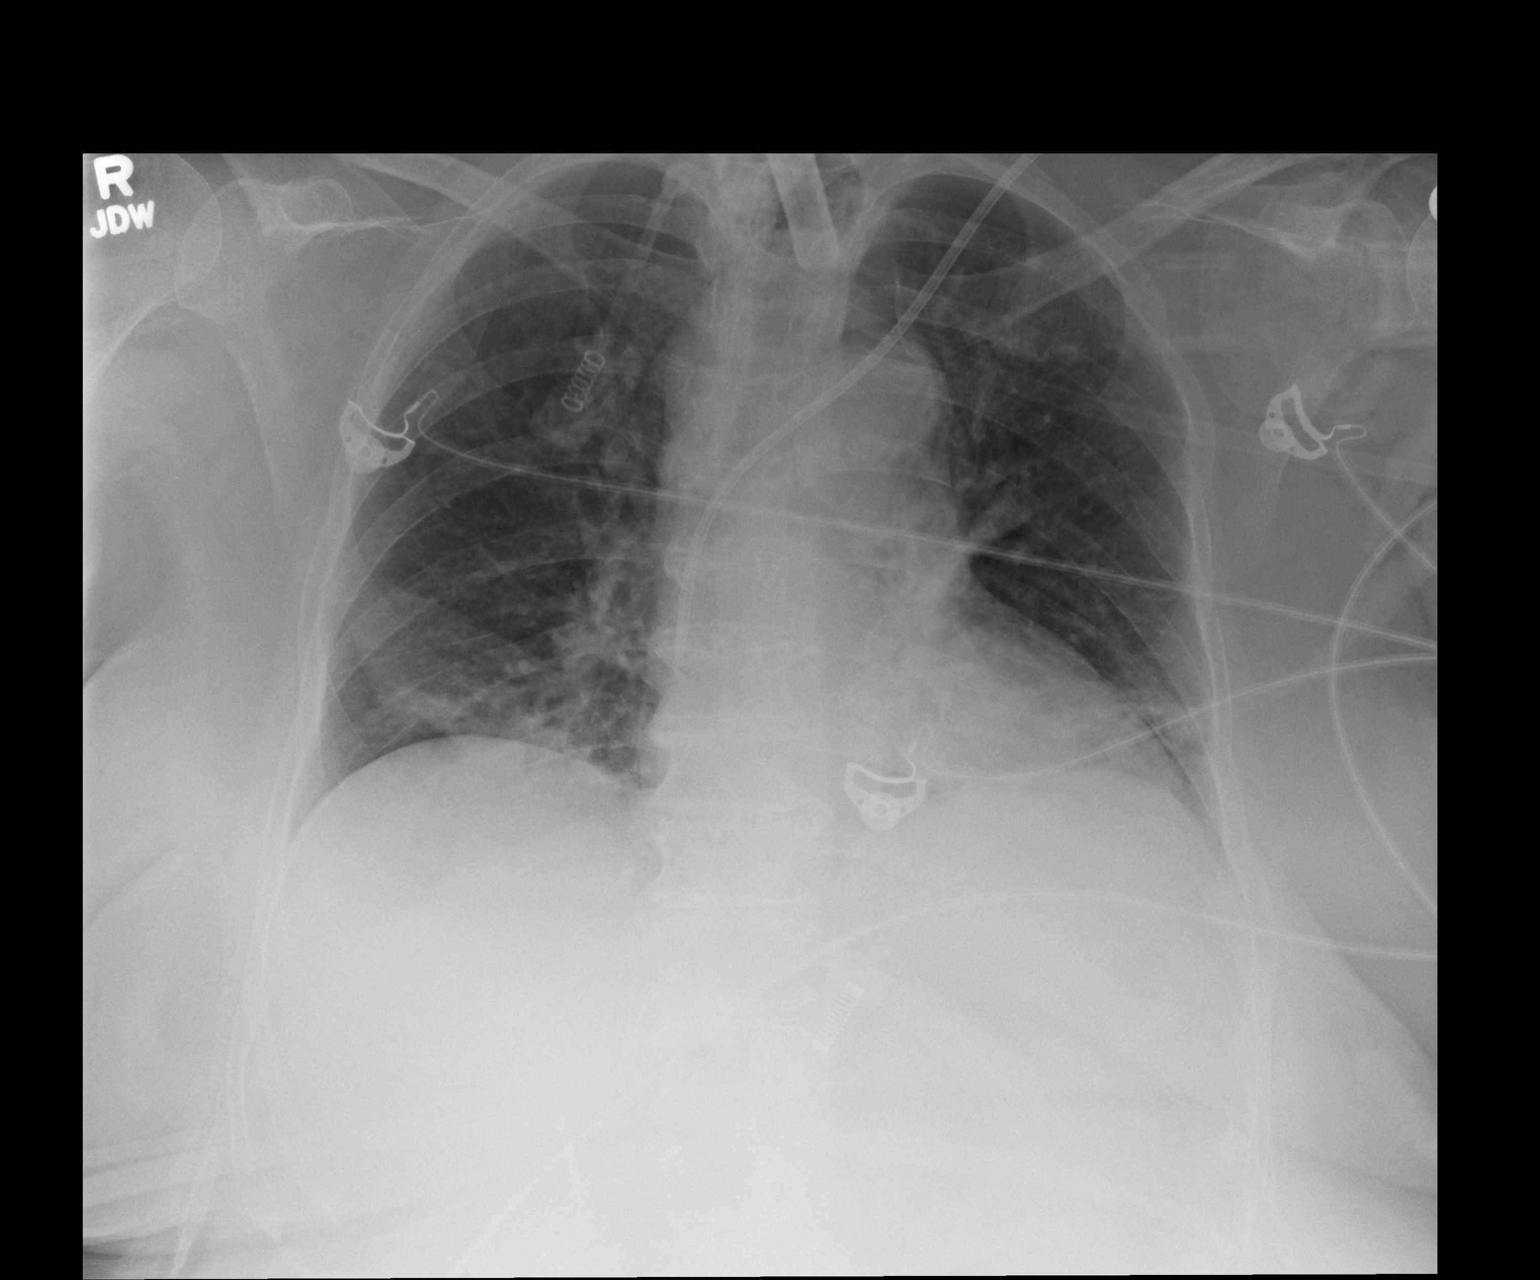

[1 of 1 positions shown; findings below may reference images not displayed]

FINDINGS: Tracheostomy tube overlies the airway. Cardiac silhouette is within
normal limits. Left jugular central venous catheter is unchanged
with tip overlying the lower SVC. Lung volumes are mildly diminished
compared to the prior study. Mild bibasilar opacities do not appear
significantly changed. No sizable pleural effusion or pneumothorax
is identified.
IMPRESSION: Unchanged bibasilar atelectasis.
